# Patient Record
Sex: Male | Born: 2000 | Race: White | Hispanic: No | Marital: Single
Health system: Southern US, Community
[De-identification: ages and names within clinical notes are randomized; demographics above are authoritative.]

---
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
## 2018-05-22 DIAGNOSIS — Z23 Encounter for immunization: Secondary | ICD-10-CM | POA: Diagnosis not present

## 2018-09-02 DIAGNOSIS — Z23 Encounter for immunization: Secondary | ICD-10-CM | POA: Diagnosis not present

## 2018-11-13 DIAGNOSIS — Z13220 Encounter for screening for lipoid disorders: Secondary | ICD-10-CM | POA: Diagnosis not present

## 2018-11-13 DIAGNOSIS — Z0000 Encounter for general adult medical examination without abnormal findings: Secondary | ICD-10-CM | POA: Diagnosis not present

## 2020-02-26 ENCOUNTER — Ambulatory Visit: Payer: BC Managed Care – PPO | Attending: Internal Medicine

## 2020-02-26 DIAGNOSIS — Z23 Encounter for immunization: Secondary | ICD-10-CM

## 2020-02-26 NOTE — Progress Notes (Signed)
   Covid-[facility] Vaccination [facility]  Name:  NYSIR FERGUSSON    MRN: [id number] DOB: 07/30/2001  02/26/2020  Mr. Covelli was observed post Covid-19 immunization for 15 minutes without incident. He was provided with Vaccine Information Sheet and instruction to access the V-Safe system.   Mr. Andres was instructed to call 911 with any severe reactions post vaccine: Marland Kitchen Difficulty breathing  . Swelling of face and throat  . A fast heartbeat  . A bad rash all over body  . Dizziness and weakness   Immunizations Administered    Name Date Dose VIS Date Route   Pfizer COVID-19 Vaccine 02/26/2020 11:24 AM 0.3 mL 11/06/2019 Intramuscular   Manufacturer: [employer], [employer]   Lot: [id number]   NDC: [address]-2

## 2020-03-19 ENCOUNTER — Ambulatory Visit: Payer: BC Managed Care – PPO | Attending: Internal Medicine

## 2020-03-19 DIAGNOSIS — Z23 Encounter for immunization: Secondary | ICD-10-CM

## 2020-03-19 NOTE — Progress Notes (Signed)
   Covid-19 Vaccination Clinic  Name:  Austin Yates    MRN: [id number] DOB: 11-03-2001  03/19/2020  [name] was observed post Covid-19 immunization for 15 minutes without incident. He was provided with Vaccine Information Sheet and instruction to access the V-Safe system.   [name] was instructed to call 911 with any severe reactions post vaccine: Marland Kitchen Difficulty breathing  . Swelling of face and throat  . A fast heartbeat  . A bad rash all over body  . Dizziness and weakness   Immunizations Administered    Name Date Dose VIS Date Route   Pfizer COVID-19 Vaccine 03/19/2020  2:31 PM 0.3 mL 01/20/2019 Intramuscular   Manufacturer: [employer], [employer]   Lot: [id number]   NDC: [address]-6

## 2020-03-21 ENCOUNTER — Ambulatory Visit: Payer: BC Managed Care – PPO

## 2021-04-09 DIAGNOSIS — R030 Elevated blood-pressure reading, without diagnosis of hypertension: Secondary | ICD-10-CM | POA: Diagnosis not present

## 2021-04-09 DIAGNOSIS — R000 Tachycardia, unspecified: Secondary | ICD-10-CM | POA: Diagnosis not present

## 2021-04-09 DIAGNOSIS — G43C0 Periodic headache syndromes in child or adult, not intractable: Secondary | ICD-10-CM | POA: Diagnosis not present

## 2021-08-22 DIAGNOSIS — Z23 Encounter for immunization: Secondary | ICD-10-CM | POA: Diagnosis not present

## 2021-10-18 DIAGNOSIS — K136 Irritative hyperplasia of oral mucosa: Secondary | ICD-10-CM | POA: Diagnosis not present
